# Patient Record
Sex: Female | Born: 2000 | Race: White | Hispanic: No | Marital: Single | Smoking: Former smoker
Health system: Southern US, Community
[De-identification: ages and names within clinical notes are randomized; demographics above are authoritative.]

## PROBLEM LIST (undated history)

## (undated) DIAGNOSIS — I10 Essential (primary) hypertension: Secondary | ICD-10-CM

## (undated) DIAGNOSIS — F909 Attention-deficit hyperactivity disorder, unspecified type: Secondary | ICD-10-CM

## (undated) HISTORY — DX: Attention-deficit hyperactivity disorder, unspecified type: F90.9

---
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
## 2018-03-14 IMAGING — DX DG CHEST 2V
2 series · 2 of 2 positions shown · non-contrast
Comparison: None available.

CLINICAL DATA: Initial evaluation for productive cough, shortness
of breath, chest pain, fever.

EXAM:
CHEST  2 VIEW

[chest pa]
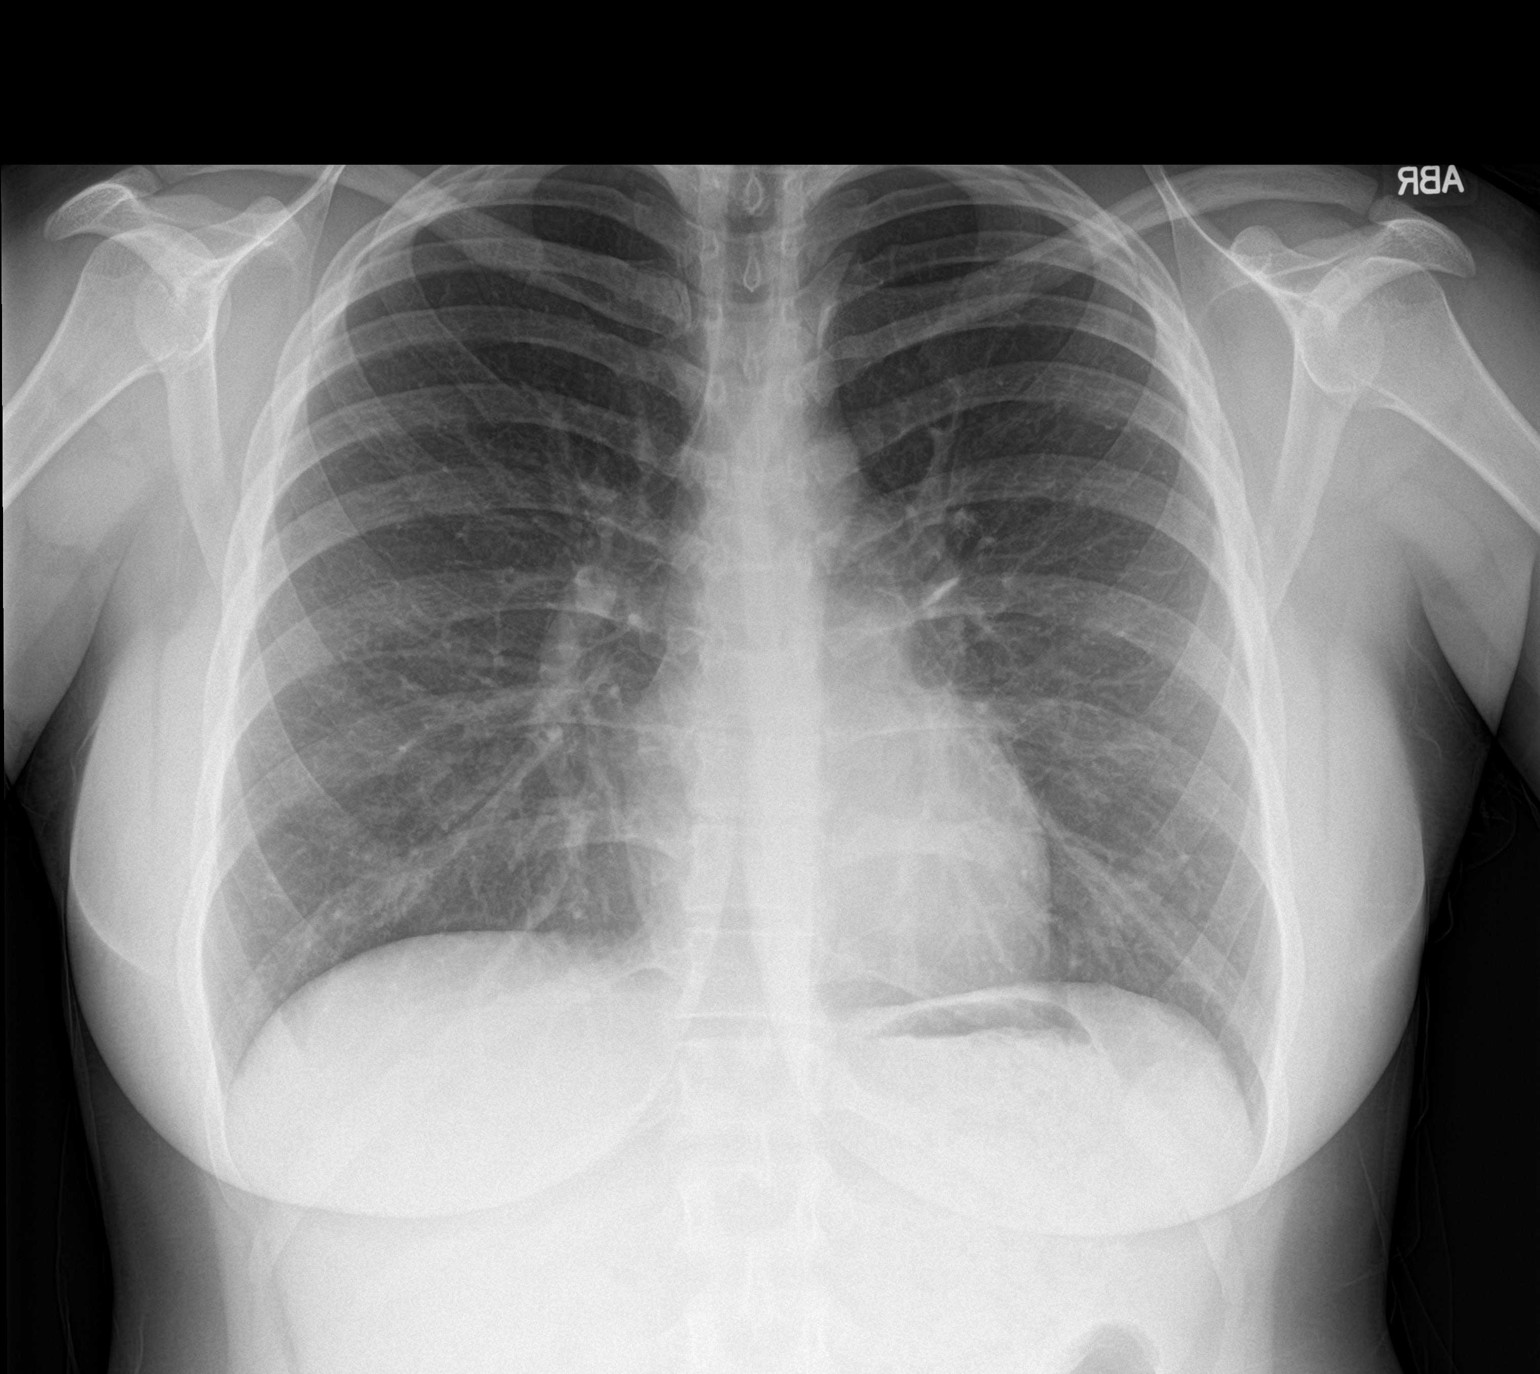

[chest lat]
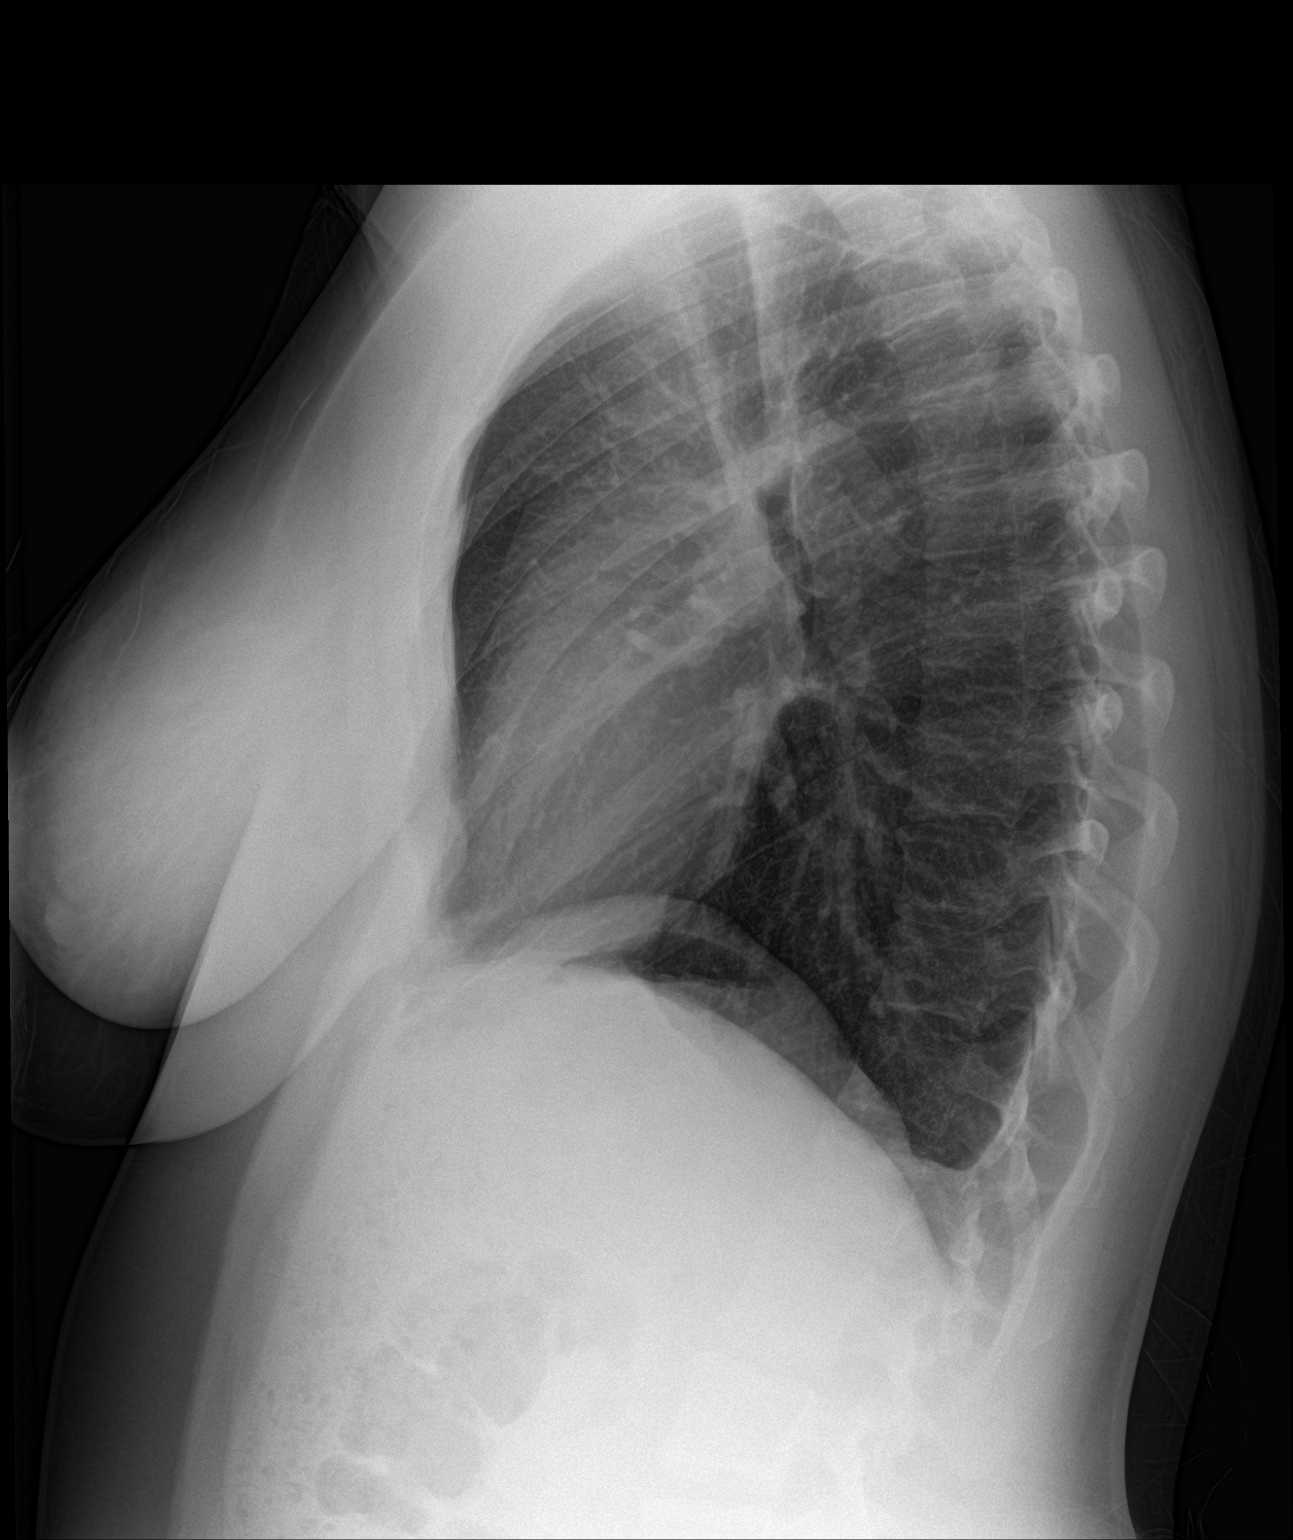

[2 of 2 positions shown; findings below may reference images not displayed]

FINDINGS: Cardiac and mediastinal silhouettes are within normal limits.

Lungs normally inflated. Scattered diffuse peribronchial thickening,
which may reflect sequelae bronchiolitis and/or reactive airways
disease. No consolidative airspace opacity to suggest pneumonia. No
pulmonary edema or pleural effusion. No pneumothorax.

No acute osseous abnormality.
IMPRESSION: Mild scattered peribronchial thickening, suggestive of acute
bronchiolitis given the history of cough and fever. No consolidative
opacity to suggest pneumonia identified.

## 2022-02-09 ENCOUNTER — Other Ambulatory Visit: Payer: Self-pay

## 2022-02-09 ENCOUNTER — Encounter (HOSPITAL_COMMUNITY): Payer: Self-pay

## 2022-02-09 ENCOUNTER — Emergency Department (HOSPITAL_COMMUNITY)
Admission: EM | Admit: 2022-02-09 | Discharge: 2022-02-09 | Disposition: A | Discharge status: Home / Self Care | Payer: Medicaid Other | Attending: Emergency Medicine | Admitting: Emergency Medicine

## 2022-02-09 DIAGNOSIS — Z3A20 20 weeks gestation of pregnancy: Secondary | ICD-10-CM | POA: Diagnosis not present

## 2022-02-09 DIAGNOSIS — O210 Mild hyperemesis gravidarum: Secondary | ICD-10-CM | POA: Insufficient documentation

## 2022-02-09 DIAGNOSIS — U071 COVID-19: Secondary | ICD-10-CM | POA: Diagnosis not present

## 2022-02-09 DIAGNOSIS — O98512 Other viral diseases complicating pregnancy, second trimester: Secondary | ICD-10-CM | POA: Diagnosis present

## 2022-02-09 DIAGNOSIS — R112 Nausea with vomiting, unspecified: Secondary | ICD-10-CM

## 2022-02-09 LAB — URINALYSIS, ROUTINE W REFLEX MICROSCOPIC
Bilirubin Urine: NEGATIVE
Glucose, UA: NEGATIVE mg/dL
Hgb urine dipstick: NEGATIVE
Ketones, ur: 80 mg/dL — AB
Leukocytes,Ua: NEGATIVE
Nitrite: NEGATIVE
Protein, ur: 30 mg/dL — AB
Specific Gravity, Urine: 1.028 (ref 1.005–1.030)
pH: 5 (ref 5.0–8.0)

## 2022-02-09 LAB — COMPREHENSIVE METABOLIC PANEL
ALT: 27 U/L (ref 0–44)
AST: 34 U/L (ref 15–41)
Albumin: 3.9 g/dL (ref 3.5–5.0)
Alkaline Phosphatase: 52 U/L (ref 38–126)
Anion gap: 9 (ref 5–15)
BUN: 8 mg/dL (ref 6–20)
CO2: 21 mmol/L — ABNORMAL LOW (ref 22–32)
Calcium: 9.1 mg/dL (ref 8.9–10.3)
Chloride: 106 mmol/L (ref 98–111)
Creatinine, Ser: 0.56 mg/dL (ref 0.44–1.00)
GFR, Estimated: >60 mL/min (ref >60)
Glucose, Bld: 97 mg/dL (ref 70–99)
Potassium: 3.5 mmol/L (ref 3.5–5.1)
Sodium: 136 mmol/L (ref 135–145)
Total Bilirubin: 0.3 mg/dL (ref 0.3–1.2)
Total Protein: 7.5 g/dL (ref 6.5–8.1)

## 2022-02-09 LAB — RESP PANEL BY RT-PCR (FLU A&B, COVID) ARPGX2
Influenza A by PCR: NEGATIVE
Influenza B by PCR: NEGATIVE
SARS Coronavirus 2 by RT PCR: POSITIVE — AB

## 2022-02-09 LAB — CBC
HCT: 40.3 % (ref 36.0–46.0)
Hemoglobin: 13.6 g/dL (ref 12.0–15.0)
MCH: 29.6 pg (ref 26.0–34.0)
MCHC: 33.7 g/dL (ref 30.0–36.0)
MCV: 87.6 fL (ref 80.0–100.0)
Platelets: 305 10*3/uL (ref 150–400)
RBC: 4.6 MIL/uL (ref 3.87–5.11)
RDW: 13.4 % (ref 11.5–15.5)
WBC: 9.7 10*3/uL (ref 4.0–10.5)
nRBC: 0 % (ref 0.0–0.2)

## 2022-02-09 LAB — CK: Total CK: 65 U/L (ref 38–234)

## 2022-02-09 LAB — LIPASE, BLOOD: Lipase: 24 U/L (ref 11–51)

## 2022-02-09 MED ORDER — DIPHENHYDRAMINE HCL 50 MG/ML IJ SOLN
25.0000 mg | Freq: Once | INTRAMUSCULAR | Status: AC
  Administered 2022-02-09: 25 mg via INTRAVENOUS
  Filled 2022-02-09: qty 1

## 2022-02-09 MED ORDER — LACTATED RINGERS IV BOLUS
2000.0000 mL | Freq: Once | INTRAVENOUS | Status: AC
  Administered 2022-02-09: 2000 mL via INTRAVENOUS

## 2022-02-09 MED ORDER — ONDANSETRON 4 MG PO TBDP: 4.0000 mg | ORAL_TABLET | Freq: Three times a day (TID) | ORAL | 0 refills | Status: DC | PRN

## 2022-02-09 MED ORDER — PROCHLORPERAZINE EDISYLATE 10 MG/2ML IJ SOLN
10.0000 mg | Freq: Once | INTRAMUSCULAR | Status: AC
  Administered 2022-02-09: 10 mg via INTRAVENOUS
  Filled 2022-02-09: qty 2

## 2022-02-09 MED ORDER — ACETAMINOPHEN 500 MG PO TABS
1000.0000 mg | ORAL_TABLET | Freq: Once | ORAL | Status: AC
  Administered 2022-02-09: 1000 mg via ORAL
  Filled 2022-02-09: qty 2

## 2022-02-09 MED ORDER — ONDANSETRON HCL 4 MG/2ML IJ SOLN
4.0000 mg | Freq: Once | INTRAMUSCULAR | Status: AC
  Administered 2022-02-09: 4 mg via INTRAVENOUS
  Filled 2022-02-09: qty 2

## 2022-02-09 MED ORDER — ALUM & MAG HYDROXIDE-SIMETH 200-200-20 MG/5ML PO SUSP
30.0000 mL | Freq: Once | ORAL | Status: DC
  Filled 2022-02-09: qty 30

## 2022-02-09 NOTE — ED Provider Notes (Signed)
[facility]-EMERGENCY DEPT [facility] Emergency Department Provider Note MRN:  [id number]  Arrival date & time: 02/09/22     Chief Complaint   Emesis   History of Present Illness   Rebecca Mcgrath is a 21 y.o. year-old female with a history of ADHD presenting to the ED with chief complaint of emesis.  24 hours of persistent nausea and vomiting.  Countless episodes of vomiting, starting to see a small amount of blood streaks in the vomit.  Denies diarrhea, passing gas normally.  Endorsing total body aches.  No chest pain or shortness of breath.  Feels very tired.  [redacted] weeks pregnant.  Review of Systems  A thorough review of systems was obtained and all systems are negative except as noted in the HPI and PMH.   Patient's Health History    Past Medical History:  Diagnosis Date   ADHD (attention deficit hyperactivity disorder)     History reviewed. No pertinent surgical history.  Family History  Problem Relation Age of Onset   Heart disease Mother    Hypertension Father     Social History   Socioeconomic History   Marital status: Single    Spouse name: Not on file   Number of children: Not on file   Years of education: Not on file   Highest education level: Not on file  Occupational History   Not on file  Tobacco Use   Smoking status: Never   Smokeless tobacco: Never  Substance and Sexual Activity   Alcohol use: No   Drug use: No   Sexual activity: Not on file  Other Topics Concern   Not on file  Social History Narrative   Not on file   Social Determinants of Health   Financial Resource Strain: Not on file  Food Insecurity: Not on file  Transportation Needs: Not on file  Physical Activity: Not on file  Stress: Not on file  Social Connections: Not on file  Intimate Partner Violence: Not on file     Physical Exam   Vitals:   02/09/22 0445 02/09/22 0500  BP: 112/80 104/65  Pulse: 91 98  Resp: 20 (!) 23  Temp:    SpO2: 97% 99%    CONSTITUTIONAL:  Well-appearing, NAD NEURO/PSYCH:  Alert and oriented x 3, no focal deficits EYES:  eyes equal and reactive ENT/NECK:  no LAD, no JVD CARDIO: Tachycardic rate, well-perfused, normal S1 and S2 PULM:  CTAB no wheezing or rhonchi GI/GU:  non-distended, non-tender MSK/SPINE:  No gross deformities, no edema SKIN:  no rash, atraumatic   *Additional and/or pertinent findings included in MDM below  Diagnostic and Interventional Summary    EKG Interpretation  Date/Time:  Thursday February 09 2022 00:33:22 EST Ventricular Rate:  135 PR Interval:  124 QRS Duration: 81 QT Interval:  297 QTC Calculation: 446 R Axis:   84 Text Interpretation: Sinus tachycardia Consider right atrial enlargement Borderline repol abnormality, diffuse leads Confirmed by Kennis Carina [phone]) on 02/09/2022 12:58:20 AM       Labs Reviewed  RESP PANEL BY RT-PCR (FLU A&B, COVID) ARPGX2 - Abnormal; Notable for the following components:      Result Value   SARS Coronavirus 2 by RT PCR POSITIVE (*)    All other components within normal limits  COMPREHENSIVE METABOLIC PANEL - Abnormal; Notable for the following components:   CO2 21 (*)    All other components within normal limits  URINALYSIS, ROUTINE W REFLEX MICROSCOPIC - Abnormal; Notable for the following components:  APPearance HAZY (*)    Ketones, ur 80 (*)    Protein, ur 30 (*)    Bacteria, UA RARE (*)    All other components within normal limits  LIPASE, BLOOD  CBC  CK    No orders to display    Medications  alum & mag hydroxide-simeth (MAALOX/MYLANTA) 200-200-20 MG/5ML suspension 30 mL (30 mLs Oral Patient Refused/Not Given 02/09/22 0214)  lactated ringers bolus 2,000 mL (0 mLs Intravenous Stopped 02/09/22 0317)  ondansetron (ZOFRAN) injection 4 mg (4 mg Intravenous Given 02/09/22 0053)  acetaminophen (TYLENOL) tablet 1,000 mg (1,000 mg Oral Given 02/09/22 0034)  prochlorperazine (COMPAZINE) injection 10 mg (10 mg Intravenous Given 02/09/22 0300)   diphenhydrAMINE (BENADRYL) injection 25 mg (25 mg Intravenous Given 02/09/22 0255)     Procedures  /  Critical Care Ultrasound ED OB Pelvic  Date/Time: 02/09/2022 1:23 AM Performed by: Sabas Sous, MD Authorized by: Sabas Sous, MD   Procedure details:    Indications: pregnant with abdominal pain     Assess:  Intrauterine pregnancy and fetal viability   Technique:  Transabdominal obstetric (HCG+) exam   Images: archived    Uterine findings:    Intrauterine pregnancy: identified     Single gestation: identified     Fetal heart rate: identified     Estimated gestational age: 65 weeks Comments:     Normal fetal activity and heart rate  ED Course and Medical Decision Making  Initial Impression and Ddx Nausea vomiting in the setting of pregnancy however hyperemesis not as common at 20 weeks.  Also considering gastroenteritis.  Abdomen soft and nontender, no fever, patient seems dehydrated given the amount of tachycardia and her general appearance.  Providing fluids and will reassess.  Past medical/surgical history that increases complexity of ED encounter: None  Interpretation of Diagnostics I personally reviewed the EKG and my interpretation is as follows: Sinus tachycardia with no concerning features    Labs are reassuring with no significant blood count or electrolyte disturbance.  COVID test is positive  Patient Reassessment and Ultimate Disposition/Management Patient feeling much better, resting comfortably, vital signs are normal, appropriate for discharge.  Patient management required discussion with the following services or consulting groups:  None  Complexity of Problems Addressed Acute illness or injury that poses threat of life of bodily function  Additional Data Reviewed and Analyzed Further history obtained from: Further history from spouse/family member  Additional Factors Impacting ED Encounter Risk Minor Procedures  Elmer Sow. Pilar Plate, MD [facility]  [facility] [email or web address]  Final Clinical Impressions(s) / ED Diagnoses     ICD-10-CM   1. Nausea and vomiting, unspecified vomiting type  R11.2     2. COVID-19  U07.1       ED Discharge Orders          Ordered    ondansetron (ZOFRAN-ODT) 4 MG disintegrating tablet  Every 8 hours PRN        02/09/22 0513             Discharge Instructions Discussed with and Provided to Patient:     Discharge Instructions      You were evaluated in the Emergency Department and after careful evaluation, we did not find any emergent condition requiring admission or further testing in the hospital.  Your exam/testing today was overall reassuring.  Symptoms seem to be due to COVID-19.  Recommend use of the Zofran medication as needed for nausea.  Tylenol at home for  discomfort, plenty of rest and fluids.  Please return to the Emergency Department if you experience any worsening of your condition.  Thank you for allowing [place] to be a part of your care.        Sabas Sous, MD 02/09/22 [phone]

## 2022-02-09 NOTE — ED Triage Notes (Addendum)
Patient arrives from home with c/o emesis ongoing since 10 pm 02/21. Pt states she has been unable to keep food or liquid down and endorses blood in her vomit. Pt also reporting generalized body aches and pains.  Pt states in triage, "my heart hurts", aprox [redacted] weeks pregnant.

## 2022-02-09 NOTE — Discharge Instructions (Signed)
You were evaluated in the Emergency Department and after careful evaluation, we did not find any emergent condition requiring admission or further testing in the hospital.  Your exam/testing today was overall reassuring.  Symptoms seem to be due to COVID-19.  Recommend use of the Zofran medication as needed for nausea.  Tylenol at home for discomfort, plenty of rest and fluids.  Please return to the Emergency Department if you experience any worsening of your condition.  Thank you for allowing [place] to be a part of your care.

## 2022-02-17 ENCOUNTER — Other Ambulatory Visit: Payer: Self-pay

## 2022-02-17 ENCOUNTER — Encounter (HOSPITAL_COMMUNITY): Payer: Self-pay | Admitting: *Deleted

## 2022-02-17 ENCOUNTER — Inpatient Hospital Stay (HOSPITAL_COMMUNITY)
Admission: AD | Admit: 2022-02-17 | Discharge: 2022-02-17 | Disposition: A | Discharge status: Home / Self Care | Payer: Medicaid Other | Attending: Obstetrics & Gynecology | Admitting: Obstetrics & Gynecology

## 2022-02-17 DIAGNOSIS — O99891 Other specified diseases and conditions complicating pregnancy: Secondary | ICD-10-CM | POA: Insufficient documentation

## 2022-02-17 DIAGNOSIS — K5900 Constipation, unspecified: Secondary | ICD-10-CM | POA: Insufficient documentation

## 2022-02-17 DIAGNOSIS — Z3A18 18 weeks gestation of pregnancy: Secondary | ICD-10-CM | POA: Diagnosis not present

## 2022-02-17 DIAGNOSIS — R000 Tachycardia, unspecified: Secondary | ICD-10-CM | POA: Insufficient documentation

## 2022-02-17 DIAGNOSIS — O99212 Obesity complicating pregnancy, second trimester: Secondary | ICD-10-CM | POA: Diagnosis not present

## 2022-02-17 DIAGNOSIS — E669 Obesity, unspecified: Secondary | ICD-10-CM | POA: Diagnosis not present

## 2022-02-17 DIAGNOSIS — O99612 Diseases of the digestive system complicating pregnancy, second trimester: Secondary | ICD-10-CM | POA: Insufficient documentation

## 2022-02-17 DIAGNOSIS — O210 Mild hyperemesis gravidarum: Secondary | ICD-10-CM | POA: Diagnosis present

## 2022-02-17 HISTORY — DX: Essential (primary) hypertension: I10

## 2022-02-17 LAB — URINALYSIS, ROUTINE W REFLEX MICROSCOPIC
Bilirubin Urine: NEGATIVE
Glucose, UA: NEGATIVE mg/dL
Hgb urine dipstick: NEGATIVE
Ketones, ur: 80 mg/dL — AB
Nitrite: NEGATIVE
Protein, ur: 30 mg/dL — AB
Specific Gravity, Urine: 1.027 (ref 1.005–1.030)
pH: 6 (ref 5.0–8.0)

## 2022-02-17 MED ORDER — PROMETHAZINE HCL 25 MG PO TABS
25.0000 mg | ORAL_TABLET | Freq: Four times a day (QID) | ORAL | 0 refills | Status: DC | PRN
Start: 2022-02-17 — End: 2022-06-29

## 2022-02-17 MED ORDER — SORBITOL 70 % SOLN
960.0000 mL | TOPICAL_OIL | Freq: Once | ORAL | Status: AC
  Administered 2022-02-17: 960 mL via RECTAL
  Filled 2022-02-17: qty 473

## 2022-02-17 MED ORDER — THIAMINE HCL 100 MG/ML IJ SOLN
Freq: Once | INTRAVENOUS | Status: AC
  Filled 2022-02-17: qty 1000

## 2022-02-17 MED ORDER — FOLIC ACID 5 MG/ML IJ SOLN: 1.0000 mg | Freq: Every day | INTRAMUSCULAR | Status: DC

## 2022-02-17 MED ORDER — PROMETHAZINE HCL 25 MG/ML IJ SOLN
25.0000 mg | Freq: Once | INTRAVENOUS | Status: AC
  Administered 2022-02-17: 25 mg via INTRAVENOUS
  Filled 2022-02-17: qty 1

## 2022-02-17 MED ORDER — THIAMINE HCL 100 MG/ML IJ SOLN: 100.0000 mg | Freq: Every day | INTRAMUSCULAR | Status: DC

## 2022-02-17 MED ORDER — FAMOTIDINE IN NACL 20-0.9 MG/50ML-% IV SOLN
20.0000 mg | Freq: Once | INTRAVENOUS | Status: AC
  Administered 2022-02-17: 20 mg via INTRAVENOUS
  Filled 2022-02-17: qty 50

## 2022-02-17 MED ORDER — SCOPOLAMINE 1 MG/3DAYS TD PT72
1.0000 | MEDICATED_PATCH | Freq: Once | TRANSDERMAL | Status: DC
  Administered 2022-02-17: 1.5 mg via TRANSDERMAL
  Filled 2022-02-17: qty 1

## 2022-02-17 MED ORDER — LACTATED RINGERS IV SOLN: INTRAVENOUS | Status: DC

## 2022-02-17 MED ORDER — POLYETHYLENE GLYCOL 3350 17 G PO PACK: 17.0000 g | PACK | Freq: Every day | ORAL | 0 refills | Status: DC

## 2022-02-17 NOTE — MAU Note (Signed)
.  DENETTA FEI is a 21 y.o. at [redacted]w[redacted]d here in MAU reporting: pt reports she has had n/v for about a week. Stated anything she eats she vomits. Describes emesis as just stomach acid with some blood mixed in it. Went to [facility] last week and they prescribed zofran but it went to a pharmacy in [place] and pt has not picked it up. Pt reports some abd discomfort but not painful.  ?Pt also concerned that her OB is not addressing her high blood pressure. Not on meds and stated her b/p runs about 140's/80's and there was protein in her urine last week and her OB did not address it.  ?LMP:  ?Onset of complaint: Feb 22 ?Pain score: 2 ?BP 104/68   Pulse (!) 103   Temp 98.2 ?F (36.8 ?C)   Resp 18   Ht 5\' 2"  (1.575 m)   Wt 83.5 kg   BMI 33.65 kg/m?  ? ?FHT:142 ?Lab orders placed from triage:  u/a  ?

## 2022-02-17 NOTE — MAU Provider Note (Signed)
?History  ?  ? ?CSN: [id number] ? ?Arrival date and time: 02/17/22 1336 ? ? Event Date/Time  ? First Provider Initiated Contact with Patient 02/17/22 1431   ?  ? ?Chief Complaint  ?Patient presents with  ? Emesis  ? ?Ms. Rebecca Mcgrath is a 21 y.o. year old G1P0 female at [redacted]w[redacted]d weeks gestation who presents to MAU reporting N/V x 1 week, unable to keep anything down, constipation, difficulty urinating except with vomiting, and feeling "uncomfortable." She reports that she has not had a BM since "before my last ER visit." She was seen at [facility] and given IVFs on 02/09/2022. She reports seeing red in her emesis last night. She also reports that she has "lost 17 lbs" since pregnancy started. Her pregravid weight was 203 lbs., weight today is 184 lbs; total weight loss is 19 lbs. ? ? ?OB History   ? ? Gravida  ?1  ? Para  ?   ? Term  ?   ? Preterm  ?   ? AB  ?   ? Living  ?   ?  ? ? SAB  ?   ? IAB  ?   ? Ectopic  ?   ? Multiple  ?   ? Live Births  ?   ?   ?  ?  ? ? ?Past Medical History:  ?Diagnosis Date  ? ADHD (attention deficit hyperactivity disorder)   ? Hypertension   ? ? ?History reviewed. No pertinent surgical history. ? ?Family History  ?Problem Relation Age of Onset  ? Heart disease Mother   ? Hypertension Father   ? ? ?Social History  ? ?Tobacco Use  ? Smoking status: Never  ? Smokeless tobacco: Never  ?Vaping Use  ? Vaping Use: Former  ?Substance Use Topics  ? Alcohol use: No  ? Drug use: No  ? ? ?Allergies: No Active Allergies ? ?Medications Prior to Admission  ?Medication Sig Dispense Refill Last Dose  ? albuterol (PROVENTIL HFA;VENTOLIN HFA) 108 (90 BASE) MCG/ACT inhaler Inhale 2 puffs into the lungs every 6 (six) hours as needed for wheezing or shortness of breath. 1 Inhaler 0 More than a month  ? benzonatate (TESSALON) 200 MG capsule Take 1 capsule (200 mg total) by mouth 3 (three) times daily as needed for cough. Swallow whole, do not chew 21 capsule 0   ? ibuprofen (ADVIL,MOTRIN) 600 MG tablet Take 1 tablet  (600 mg total) by mouth every 6 (six) hours as needed. 30 tablet 0   ? ondansetron (ZOFRAN-ODT) 4 MG disintegrating tablet Take 1 tablet (4 mg total) by mouth every 8 (eight) hours as needed for nausea or vomiting. 20 tablet 0   ? predniSONE (DELTASONE) 20 MG tablet Take 2 tablets (40 mg total) by mouth daily. 10 tablet 0   ? ? ?Review of Systems  ?Constitutional:  Positive for appetite change and unexpected weight change.  ?HENT: Negative.    ?Eyes: Negative.   ?Cardiovascular: Negative.   ?Gastrointestinal:  Positive for constipation, nausea and vomiting.  ?Endocrine: Negative.   ?Genitourinary: Negative.   ?Musculoskeletal: Negative.   ?Skin: Negative.   ?Allergic/Immunologic: Negative.   ?Neurological: Negative.   ?Hematological: Negative.   ?Psychiatric/Behavioral: Negative.    ?Physical Exam  ? ?Blood pressure 104/68, pulse (!) 103, temperature 98.2 ?F (36.8 ?C), resp. rate 18, height 5\' 2"  (1.575 m), weight 83.5 kg. ? ?Physical Exam ?Vitals and nursing note reviewed.  ?Constitutional:   ?   Appearance: Normal appearance. She  is obese.  ?HENT:  ?   Head: Normocephalic and atraumatic.  ?Cardiovascular:  ?   Rate and Rhythm: Tachycardia present.  ?Pulmonary:  ?   Effort: Pulmonary effort is normal.  ?Abdominal:  ?   Palpations: Abdomen is soft.  ?Genitourinary: ?   Comments: Not indicated ? ?Skin: ?   General: Skin is warm and dry.  ?Neurological:  ?   Mental Status: She is alert and oriented to person, place, and time.  ?Psychiatric:     ?   Mood and Affect: Mood normal.     ?   Behavior: Behavior normal.     ?   Thought Content: Thought content normal.     ?   Judgment: Judgment normal.  ? ?FHTs by doppler: 142 bpm  ?MAU Course  ?Procedures ? ?MDM ?IVFs: Phenergan 25 mg in LR 1000 ml @ 999 ml/hr; followed by LR 1000 ml with Folic Acid and Thiamine @ 999 ml/hr -- resolved nausea/vomiting ?Pepcid 20 mg IVPB ?Scopolamine patch ?SMOG Enema -- (+) BM with some abdominal cramping ?PO Challenge -- patient tolerated  well  ? ?Results for orders placed or performed during the hospital encounter of 02/17/22 (from the past 24 hour(s))  ?Urinalysis, Routine w reflex microscopic Urine, Clean Catch     Status: Abnormal  ? Collection Time: 02/17/22  2:35 PM  ?Result Value Ref Range  ? Color, Urine AMBER (A) YELLOW  ? APPearance CLOUDY (A) CLEAR  ? Specific Gravity, Urine 1.027 1.005 - 1.030  ? pH 6.0 5.0 - 8.0  ? Glucose, UA NEGATIVE NEGATIVE mg/dL  ? Hgb urine dipstick NEGATIVE NEGATIVE  ? Bilirubin Urine NEGATIVE NEGATIVE  ? Ketones, ur 80 (A) NEGATIVE mg/dL  ? Protein, ur 30 (A) NEGATIVE mg/dL  ? Nitrite NEGATIVE NEGATIVE  ? Leukocytes,Ua TRACE (A) NEGATIVE  ? RBC / HPF 6-10 0 - 5 RBC/hpf  ? WBC, UA 11-20 0 - 5 WBC/hpf  ? Bacteria, UA MANY (A) NONE SEEN  ? Squamous Epithelial / LPF 11-20 0 - 5  ? Mucus PRESENT   ?  ?Assessment and Plan  ?Morning sickness  ?- Rx for Phenergan 25 mg every 6 hours prn N/V ?- Information provided on morning sickness ? ?Constipation, unspecified constipation type  ?- Rx for Miralax 17g packet daily ? ?[redacted] weeks gestation of pregnancy  ? ?- Discharge patient ?- Keep scheduled appt with [facility] on 03/03/2022 ?- Patient verbalized an understanding of the plan of care and agrees.  ? ? ?Raelyn Mora, CNM ?02/17/2022, 3:10 PM  ?

## 2022-03-31 ENCOUNTER — Other Ambulatory Visit: Payer: Self-pay

## 2022-03-31 ENCOUNTER — Encounter (HOSPITAL_COMMUNITY): Payer: Self-pay | Admitting: Obstetrics & Gynecology

## 2022-03-31 ENCOUNTER — Inpatient Hospital Stay (HOSPITAL_COMMUNITY)
Admission: AD | Admit: 2022-03-31 | Discharge: 2022-03-31 | Disposition: A | Discharge status: Home / Self Care | Payer: Medicaid Other | Attending: Obstetrics & Gynecology | Admitting: Obstetrics & Gynecology

## 2022-03-31 DIAGNOSIS — O23592 Infection of other part of genital tract in pregnancy, second trimester: Secondary | ICD-10-CM | POA: Diagnosis not present

## 2022-03-31 DIAGNOSIS — O10912 Unspecified pre-existing hypertension complicating pregnancy, second trimester: Secondary | ICD-10-CM | POA: Insufficient documentation

## 2022-03-31 DIAGNOSIS — O4692 Antepartum hemorrhage, unspecified, second trimester: Secondary | ICD-10-CM | POA: Insufficient documentation

## 2022-03-31 DIAGNOSIS — Z3A24 24 weeks gestation of pregnancy: Secondary | ICD-10-CM | POA: Diagnosis not present

## 2022-03-31 DIAGNOSIS — Z6790 Unspecified blood type, Rh positive: Secondary | ICD-10-CM | POA: Diagnosis not present

## 2022-03-31 DIAGNOSIS — O98812 Other maternal infectious and parasitic diseases complicating pregnancy, second trimester: Secondary | ICD-10-CM | POA: Diagnosis not present

## 2022-03-31 DIAGNOSIS — B3731 Acute candidiasis of vulva and vagina: Secondary | ICD-10-CM | POA: Diagnosis not present

## 2022-03-31 LAB — URINALYSIS, ROUTINE W REFLEX MICROSCOPIC
Bilirubin Urine: NEGATIVE
Glucose, UA: NEGATIVE mg/dL
Hgb urine dipstick: NEGATIVE
Ketones, ur: NEGATIVE mg/dL
Nitrite: NEGATIVE
Protein, ur: NEGATIVE mg/dL
Specific Gravity, Urine: 1.001 — ABNORMAL LOW (ref 1.005–1.030)
pH: 7 (ref 5.0–8.0)

## 2022-03-31 LAB — WET PREP, GENITAL
Clue Cells Wet Prep HPF POC: NONE SEEN
Sperm: NONE SEEN
Trich, Wet Prep: NONE SEEN
WBC, Wet Prep HPF POC: >=10 — AB (ref <10)

## 2022-03-31 MED ORDER — TERCONAZOLE 0.4 % VA CREA: 1.0000 | TOPICAL_CREAM | Freq: Every day | VAGINAL | 0 refills | Status: AC

## 2022-03-31 NOTE — MAU Provider Note (Signed)
?History  ?  ? ?CSN: [id number] ? ?Arrival date and time: 03/31/22 1308 ? ? Event Date/Time  ? First Provider Initiated Contact with Patient 03/31/22 1412   ?  ? ?Chief Complaint  ?Patient presents with  ? Vaginal Bleeding  ? ?Ms. Rebecca Mcgrath is a 21 y.o. G1P0 at [redacted]w[redacted]d who presents to MAU for vaginal bleeding. Patient endorses 1 episode of vaginal bleeding this morning. Patient reports she went to urinate this morning, and when she wiped, she saw some mucus-like discharge tinged with blood. Patient reports she wiped a couple more times and the bleeding went away. Patient denies any additional bleeding episodes prior to today or since this morning. Patient reports last intercourse in October. Patient also reports some vague discomfort in the pelvis that appeared this morning with the bleeding and has not changed. Patient denies pain or contractions. Patient does not have any photos from the bleeding this morning. ? ?A review of records in Care Everywhere from [facility] prenatal care shows blood type O+. RhoGAM not indicated. ? ?Pt denies VB, LOF, ctx, decreased FM, vaginal discharge/odor/itching. ?Pt denies N/V, abdominal pain, constipation, diarrhea, or urinary problems. ?Pt denies fever, chills, fatigue, sweating or changes in appetite. ?Pt denies SOB or chest pain. ?Pt denies dizziness, HA, light-headedness, weakness. ? ? ?OB History   ? ? Gravida  ?1  ? Para  ?   ? Term  ?   ? Preterm  ?   ? AB  ?   ? Living  ?   ?  ? ? SAB  ?   ? IAB  ?   ? Ectopic  ?   ? Multiple  ?   ? Live Births  ?   ?   ?  ?  ? ? ?Past Medical History:  ?Diagnosis Date  ? ADHD (attention deficit hyperactivity disorder)   ? Hypertension   ? ? ?History reviewed. No pertinent surgical history. ? ?Family History  ?Problem Relation Age of Onset  ? Heart disease Mother   ? Hypertension Father   ? ? ?Social History  ? ?Tobacco Use  ? Smoking status: Never  ? Smokeless tobacco: Never  ?Vaping Use  ? Vaping Use: Former  ?Substance Use Topics  ?  Alcohol use: No  ? Drug use: No  ? ? ?Allergies: No Known Allergies ? ?Medications Prior to Admission  ?Medication Sig Dispense Refill Last Dose  ? folic acid (FOLVITE) 1 MG tablet Take 1 mg by mouth daily.   03/31/2022  ? Prenatal Vit-Fe Fumarate-FA (MULTIVITAMIN-PRENATAL) 27-0.8 MG TABS tablet Take 1 tablet by mouth daily at 12 noon.   03/31/2022  ? pyridoxine (B-6) 100 MG tablet Take 100 mg by mouth daily.   03/31/2022  ? famotidine (PEPCID) 10 MG tablet Take 10 mg by mouth 2 (two) times daily.   More than a month  ? polyethylene glycol (MIRALAX / GLYCOLAX) 17 g packet Take 17 g by mouth daily. 30 each 0 More than a month  ? promethazine (PHENERGAN) 25 MG tablet Take 1 tablet (25 mg total) by mouth every 6 (six) hours as needed for nausea or vomiting. Insert vaginally, if not able to keep anything down 30 tablet 0 More than a month  ? ? ?Review of Systems  ?Constitutional:  Negative for chills, diaphoresis, fatigue and fever.  ?Eyes:  Negative for visual disturbance.  ?Respiratory:  Negative for shortness of breath.   ?Cardiovascular:  Negative for chest pain.  ?Gastrointestinal:  Negative for abdominal  pain, constipation, diarrhea, nausea and vomiting.  ?Genitourinary:  Positive for vaginal bleeding. Negative for dysuria, flank pain, frequency, pelvic pain, urgency and vaginal discharge.  ?Neurological:  Negative for dizziness, weakness, light-headedness and headaches.  ? ?Physical Exam  ? ?Blood pressure 116/65, pulse 93, temperature 98.3 ?F (36.8 ?C), temperature source Oral, resp. rate 17, height 5\' 2"  (1.575 m), weight 91.4 kg, SpO2 98 %. ? ?Patient Vitals for the past 24 hrs: ? BP Temp Temp src Pulse Resp SpO2 Height Weight  ?03/31/22 1439 116/65 -- -- 93 -- -- -- --  ?03/31/22 1411 112/78 -- -- 100 -- 98 % -- --  ?03/31/22 1408 -- -- -- -- -- 97 % -- --  ?03/31/22 1352 110/69 98.3 ?F (36.8 ?C) Oral 97 17 96 % -- --  ?03/31/22 1349 110/69 98.3 ?F (36.8 ?C) Oral 99 19 96 % 5\' 2"  (1.575 m) 91.4 kg  ? ?Physical  Exam ?Vitals and nursing note reviewed. Exam conducted with a chaperone present.  ?Constitutional:   ?   General: She is not in acute distress. ?   Appearance: Normal appearance. She is not ill-appearing, toxic-appearing or diaphoretic.  ?HENT:  ?   Head: Normocephalic and atraumatic.  ?Pulmonary:  ?   Effort: Pulmonary effort is normal.  ?Genitourinary: ?   General: Normal vulva.  ?   Labia:     ?   Right: No rash, tenderness, lesion or injury.     ?   Left: No rash, tenderness, lesion or injury.   ?   Vagina: Vaginal discharge present. No tenderness, bleeding or lesions.  ?   Cervix: No friability, lesion, erythema or cervical bleeding.  ?   Comments: No blood or brown discharge visible in vagina. Small amount of white discharge, some clumped, possible yeast infection. Cervix visually closed. On exam, cervix long/closed/posterior. ?Neurological:  ?   Mental Status: She is alert and oriented to person, place, and time.  ?Psychiatric:     ?   Mood and Affect: Mood normal.     ?   Behavior: Behavior normal.     ?   Thought Content: Thought content normal.     ?   Judgment: Judgment normal.  ? ?Results for orders placed or performed during the hospital encounter of 03/31/22 (from the past 24 hour(s))  ?Urinalysis, Routine w reflex microscopic Urine, Clean Catch     Status: Abnormal  ? Collection Time: 03/31/22  2:19 PM  ?Result Value Ref Range  ? Color, Urine STRAW (A) YELLOW  ? APPearance CLEAR CLEAR  ? Specific Gravity, Urine 1.001 (L) 1.005 - 1.030  ? pH 7.0 5.0 - 8.0  ? Glucose, UA NEGATIVE NEGATIVE mg/dL  ? Hgb urine dipstick NEGATIVE NEGATIVE  ? Bilirubin Urine NEGATIVE NEGATIVE  ? Ketones, ur NEGATIVE NEGATIVE mg/dL  ? Protein, ur NEGATIVE NEGATIVE mg/dL  ? Nitrite NEGATIVE NEGATIVE  ? Leukocytes,Ua TRACE (A) NEGATIVE  ? RBC / HPF 0-5 0 - 5 RBC/hpf  ? WBC, UA 0-5 0 - 5 WBC/hpf  ? Bacteria, UA FEW (A) NONE SEEN  ?Wet prep, genital     Status: Abnormal  ? Collection Time: 03/31/22  2:34 PM  ?Result Value Ref  Range  ? Yeast Wet Prep HPF POC PRESENT (A) NONE SEEN  ? Trich, Wet Prep NONE SEEN NONE SEEN  ? Clue Cells Wet Prep HPF POC NONE SEEN NONE SEEN  ? WBC, Wet Prep HPF POC >=10 (A) <10  ? Sperm NONE SEEN   ? ?  MAU Course  ?Procedures ? ?MDM ?-VB per pt, no bleeding visualized on exam ?-cervix long/closed/posterior ?-EFM: reactive ?      -baseline: 135 ?      -variability: moderate ?      -accels: present, 10x10 ?      -decels: absent ?      -TOCO: quiet ?-WetPrep: + yeast ?-GC/CT collected ?-sending urine for culture, UA with leuks/few bacteria ?-pt discharged to home in stable condition ? ?Orders Placed This Encounter  ?Procedures  ? Culture, OB Urine  ?  Standing Status:   Standing  ?  Number of Occurrences:   1  ? Wet prep, genital  ?  Standing Status:   Standing  ?  Number of Occurrences:   1  ? Urinalysis, Routine w reflex microscopic Urine, Clean Catch  ?  Standing Status:   Standing  ?  Number of Occurrences:   1  ? Discharge patient  ?  Order Specific Question:   Discharge disposition  ?  Answer:   01-Home or Self Care [1]  ?  Order Specific Question:   Discharge patient date  ?  Answer:   03/31/2022  ? ?Meds ordered this encounter  ?Medications  ? terconazole (TERAZOL 7) 0.4 % vaginal cream  ?  Sig: Place 1 applicator vaginally at bedtime for 7 days.  ?  Dispense:  45 g  ?  Refill:  0  ?  Order Specific Question:   Supervising Provider  ?  Answer:   Reva BoresRATT, TANYA S [[id number]]  ? ?Assessment and Plan  ? ?1. Vaginal bleeding in pregnancy, second trimester   ?2. Blood type, Rh positive   ?3. Vaginal yeast infection   ?4. [redacted] weeks gestation of pregnancy   ? ?Allergies as of 03/31/2022   ?No Known Allergies ?  ? ?  ?Medication List  ?  ? ?TAKE these medications   ? ?famotidine 10 MG tablet ?Commonly known as: PEPCID ?Take 10 mg by mouth 2 (two) times daily. ?  ?folic acid 1 MG tablet ?Commonly known as: FOLVITE ?Take 1 mg by mouth daily. ?  ?multivitamin-prenatal 27-0.8 MG Tabs tablet ?Take 1 tablet by mouth daily at 12  noon. ?  ?polyethylene glycol 17 g packet ?Commonly known as: MIRALAX / GLYCOLAX ?Take 17 g by mouth daily. ?  ?promethazine 25 MG tablet ?Commonly known as: PHENERGAN ?Take 1 tablet (25 mg total) by mouth every 6

## 2022-03-31 NOTE — MAU Note (Signed)
..  Rebecca Mcgrath is a 21 y.o. at [redacted]w[redacted]d here in MAU reporting: Vaginal bleeding that started at 0910 when she got up to use the restroom she saw blood when she wiped. Rates 7/10 pain lower abdominal cramping.  ? ?110/69 BP  ?96 O2 SAT  ?97 HR.  ?Temp 98.3 ? ?Felipa Furnace RN.  ?

## 2022-04-01 LAB — CULTURE, OB URINE: Culture: NO GROWTH

## 2022-04-03 LAB — GC/CHLAMYDIA PROBE AMP (~~LOC~~) NOT AT ARMC
Chlamydia: NEGATIVE
Comment: NEGATIVE
Comment: NORMAL
Neisseria Gonorrhea: NEGATIVE

## 2022-05-07 ENCOUNTER — Encounter (HOSPITAL_COMMUNITY): Payer: Self-pay

## 2022-05-07 ENCOUNTER — Emergency Department (HOSPITAL_COMMUNITY)
Admission: EM | Admit: 2022-05-07 | Discharge: 2022-05-07 | Disposition: A | Discharge status: Home / Self Care | Payer: Medicaid Other | Attending: Emergency Medicine | Admitting: Emergency Medicine

## 2022-05-07 ENCOUNTER — Other Ambulatory Visit: Payer: Self-pay

## 2022-05-07 DIAGNOSIS — Z3A30 30 weeks gestation of pregnancy: Secondary | ICD-10-CM | POA: Diagnosis not present

## 2022-05-07 DIAGNOSIS — R112 Nausea with vomiting, unspecified: Secondary | ICD-10-CM

## 2022-05-07 DIAGNOSIS — N390 Urinary tract infection, site not specified: Secondary | ICD-10-CM

## 2022-05-07 DIAGNOSIS — O219 Vomiting of pregnancy, unspecified: Secondary | ICD-10-CM | POA: Diagnosis not present

## 2022-05-07 DIAGNOSIS — K920 Hematemesis: Secondary | ICD-10-CM

## 2022-05-07 LAB — CBC WITH DIFFERENTIAL/PLATELET
Abs Immature Granulocytes: 0.04 10*3/uL (ref 0.00–0.07)
Basophils Absolute: 0 10*3/uL (ref 0.0–0.1)
Basophils Relative: 0 %
Eosinophils Absolute: 0.1 10*3/uL (ref 0.0–0.5)
Eosinophils Relative: 1 %
HCT: 33.3 % — ABNORMAL LOW (ref 36.0–46.0)
Hemoglobin: 10.8 g/dL — ABNORMAL LOW (ref 12.0–15.0)
Immature Granulocytes: 0 %
Lymphocytes Relative: 19 %
Lymphs Abs: 2 10*3/uL (ref 0.7–4.0)
MCH: 29.8 pg (ref 26.0–34.0)
MCHC: 32.4 g/dL (ref 30.0–36.0)
MCV: 92 fL (ref 80.0–100.0)
Monocytes Absolute: 0.5 10*3/uL (ref 0.1–1.0)
Monocytes Relative: 5 %
Neutro Abs: 7.9 10*3/uL — ABNORMAL HIGH (ref 1.7–7.7)
Neutrophils Relative %: 75 %
Platelets: 290 10*3/uL (ref 150–400)
RBC: 3.62 MIL/uL — ABNORMAL LOW (ref 3.87–5.11)
RDW: 13.2 % (ref 11.5–15.5)
WBC: 10.6 10*3/uL — ABNORMAL HIGH (ref 4.0–10.5)
nRBC: 0 % (ref 0.0–0.2)

## 2022-05-07 LAB — COMPREHENSIVE METABOLIC PANEL
ALT: 13 U/L (ref 0–44)
AST: 17 U/L (ref 15–41)
Albumin: 3.2 g/dL — ABNORMAL LOW (ref 3.5–5.0)
Alkaline Phosphatase: 58 U/L (ref 38–126)
Anion gap: 7 (ref 5–15)
BUN: 7 mg/dL (ref 6–20)
CO2: 22 mmol/L (ref 22–32)
Calcium: 9.1 mg/dL (ref 8.9–10.3)
Chloride: 110 mmol/L (ref 98–111)
Creatinine, Ser: 0.47 mg/dL (ref 0.44–1.00)
GFR, Estimated: >60 mL/min (ref >60)
Glucose, Bld: 136 mg/dL — ABNORMAL HIGH (ref 70–99)
Potassium: 3.7 mmol/L (ref 3.5–5.1)
Sodium: 139 mmol/L (ref 135–145)
Total Bilirubin: 0.5 mg/dL (ref 0.3–1.2)
Total Protein: 6.5 g/dL (ref 6.5–8.1)

## 2022-05-07 LAB — URINALYSIS, ROUTINE W REFLEX MICROSCOPIC
Bilirubin Urine: NEGATIVE
Glucose, UA: NEGATIVE mg/dL
Hgb urine dipstick: NEGATIVE
Ketones, ur: 20 mg/dL — AB
Nitrite: NEGATIVE
Protein, ur: 30 mg/dL — AB
Specific Gravity, Urine: 1.017 (ref 1.005–1.030)
pH: 8 (ref 5.0–8.0)

## 2022-05-07 LAB — LIPASE, BLOOD: Lipase: 21 U/L (ref 11–51)

## 2022-05-07 MED ORDER — CEPHALEXIN 500 MG PO CAPS: 500.0000 mg | ORAL_CAPSULE | Freq: Four times a day (QID) | ORAL | 0 refills | Status: DC

## 2022-05-07 MED ORDER — CEFTRIAXONE SODIUM 1 G IJ SOLR
1.0000 g | Freq: Once | INTRAMUSCULAR | Status: AC
  Administered 2022-05-07: 1 g via INTRAVENOUS
  Filled 2022-05-07: qty 10

## 2022-05-07 MED ORDER — SODIUM CHLORIDE 0.9 % IV BOLUS
1000.0000 mL | Freq: Once | INTRAVENOUS | Status: AC
Start: 2022-05-07 — End: 2022-05-07
  Administered 2022-05-07: 1000 mL via INTRAVENOUS

## 2022-05-07 MED ORDER — ONDANSETRON HCL 4 MG/2ML IJ SOLN
4.0000 mg | Freq: Once | INTRAMUSCULAR | Status: AC
  Administered 2022-05-07: 4 mg via INTRAVENOUS
  Filled 2022-05-07: qty 2

## 2022-05-07 MED ORDER — PANTOPRAZOLE SODIUM 20 MG PO TBEC: 20.0000 mg | DELAYED_RELEASE_TABLET | Freq: Every day | ORAL | 0 refills | Status: DC

## 2022-05-07 NOTE — ED Notes (Signed)
This RN spoke with [facility] RN- States is coming to assess pt

## 2022-05-07 NOTE — Progress Notes (Signed)
Pt is a G1P0 at 56 3/[redacted] weeks gestation presenting with c/o vomiting x 2-3 days. Pt says she has seen bright red blood in her emesis.Pt says she has had N&V throughout this pregnant. She gets her care at [facility] in [place]. She denies any vaginal bleeding or leaking of fluid. Reports good fetal movement.

## 2022-05-07 NOTE — ED Provider Notes (Signed)
[facility]-EMERGENCY DEPT Provider Note   CSN: [id number] Arrival date & time: 05/07/22  1150     History  Chief Complaint  Patient presents with   [redacted] weeks pregnant   Hematemesis    Rebecca Mcgrath is a 21 y.o. female.  The history is provided by the patient and medical records. No language interpreter was used.   21 year old G1P0 female who is [redacted] weeks pregnant presents for evaluation of persistent nausea.  Patient mentioned that she has had nausea throughout her pregnancy.  She also has recurrent vomiting usually noticing trace of blood in her vomitus.  This is an ongoing issue.  She mention several from persistent heartburn tries to avoid eating while at work working for [employer].  States her eating is inconsistent and she suffers from persistent heartburn.  She felt that she is not getting adequate care from her current OB/GYN.  She would like to be evaluated to make sure "everything is okay for me and my baby".  She does not endorse any fever, productive cough, hemoptysis, dysuria, abdominal pain, vaginal bleeding, or lack of fetal movement.  Last emesis was this morning.  Home Medications Prior to Admission medications   Medication Sig Start Date End Date Taking? Authorizing Provider  famotidine (PEPCID) 10 MG tablet Take 10 mg by mouth 2 (two) times daily.    [provider]  folic acid (FOLVITE) 1 MG tablet Take 1 mg by mouth daily.    [provider]  polyethylene glycol (MIRALAX / GLYCOLAX) 17 g packet Take 17 g by mouth daily. 02/17/22   Raelyn Moraawson, Rolitta, CNM  Prenatal Vit-Fe Fumarate-FA (MULTIVITAMIN-PRENATAL) 27-0.8 MG TABS tablet Take 1 tablet by mouth daily at 12 noon.    [provider]  promethazine (PHENERGAN) 25 MG tablet Take 1 tablet (25 mg total) by mouth every 6 (six) hours as needed for nausea or vomiting. Insert vaginally, if not able to keep anything down 02/17/22   Raelyn Moraawson, Rolitta, CNM  pyridoxine (B-6) 100 MG  tablet Take 100 mg by mouth daily.    [provider]      Allergies    Patient has no known allergies.    Review of Systems   Review of Systems  All other systems reviewed and are negative.  Physical Exam Updated Vital Signs BP 127/85 (BP Location: Right Arm)   Pulse 99   Temp 98.2 F (36.8 C) (Oral)   Resp 16   Ht 5\' 2"  (1.575 m)   Wt 77.1 kg   SpO2 98%   BMI 31.09 kg/m  Physical Exam Vitals and nursing note reviewed.  Constitutional:      General: She is not in acute distress.    Appearance: She is well-developed. She is obese.  HENT:     Head: Atraumatic.  Eyes:     Conjunctiva/sclera: Conjunctivae normal.  Cardiovascular:     Rate and Rhythm: Normal rate and regular rhythm.     Pulses: Normal pulses.     Heart sounds: Normal heart sounds.  Pulmonary:     Effort: Pulmonary effort is normal.     Breath sounds: No wheezing, rhonchi or rales.  Abdominal:     Palpations: Abdomen is soft.     Tenderness: There is no abdominal tenderness.     Comments: Gravid abdomen  Musculoskeletal:     Cervical back: Neck supple.  Skin:    Findings: No rash.  Neurological:     Mental Status: She is alert.  Mental status is at baseline.  Psychiatric:        Mood and Affect: Mood normal.    ED Results / Procedures / Treatments   Labs (all labs ordered are listed, but only abnormal results are displayed) Labs Reviewed  CBC WITH DIFFERENTIAL/PLATELET - Abnormal; Notable for the following components:      Result Value   WBC 10.6 (*)    RBC 3.62 (*)    Hemoglobin 10.8 (*)    HCT 33.3 (*)    Neutro Abs 7.9 (*)    All other components within normal limits  COMPREHENSIVE METABOLIC PANEL - Abnormal; Notable for the following components:   Glucose, Bld 136 (*)    Albumin 3.2 (*)    All other components within normal limits  URINALYSIS, ROUTINE W REFLEX MICROSCOPIC - Abnormal; Notable for the following components:   APPearance CLOUDY (*)    Ketones, ur 20 (*)     Protein, ur 30 (*)    Leukocytes,Ua MODERATE (*)    Bacteria, UA MANY (*)    All other components within normal limits  URINE CULTURE  LIPASE, BLOOD    EKG None  Radiology No results found.  Procedures Procedures    Medications Ordered in ED Medications  cefTRIAXone (ROCEPHIN) 1 g in sodium chloride 0.9 % 100 mL IVPB (has no administration in time range)  sodium chloride 0.9 % bolus 1,000 mL (1,000 mLs Intravenous New Bag/Given 05/07/22 1307)  ondansetron (ZOFRAN) injection 4 mg (4 mg Intravenous Given 05/07/22 1308)    ED Course/ Medical Decision Making/ A&P                           Medical Decision Making Amount and/or Complexity of Data Reviewed Labs: ordered.  Risk Prescription drug management.   BP 127/85 (BP Location: Right Arm)   Pulse 99   Temp 98.2 F (36.8 C) (Oral)   Resp 16   Ht 5\' 2"  (1.575 m)   Wt 77.1 kg   SpO2 98%   BMI 31.09 kg/m   12:46 PM This is a 21 year old female G1, P0 approximately [redacted] weeks pregnant presenting complaining of hyperemesis gravidarum with hematochezia which has been an ongoing issue throughout her pregnancy.  She felt her primary concern is the lack of prenatal care that she received from OB/GYN and she would like to be evaluated to ensure that everything is fine for her pregnancy.  She admits that she is not eating and drinking appropriately as she does not eat much while she is working.  She works approximately 45 to 50 hours a week and she normally likes to drink cold fluid as opposed to eating solid food.  She mention eating seems to worsen her symptoms causing her to vomit.  She usually noticed trace of blood in her vomitus and her vomitus is usually "projectile".  I suspect patient may suffer from Mallory-weiss tear from her vomiting causing bleeding.  I have considered peptic ulcer disease, Boerhave's, gastritis, GERD.  Since patient is [redacted] weeks pregnant, will request rapid OB nurse to assess her fetus.  Work-up initiated,  will give IV fluid, and antinausea medication meantime.  2:32 PM Labs obtained and independently reviewed interpreted by me.  Labs remarkable for a urine that shows cloudy urine, moderate leukocyte Estrace and many bacteria suggestive of underlying urinary tract infection.  Especially in the setting of patient being pregnant, she would benefit from antibiotic to treat UTI.  Urine culture  sent.  We will give Rocephin in the ED.  The remainder of her labs are reassuring.  Appreciate evaluation by our rapid OB nurse who has successful the status of the fetus and was noted to shows no concerning findings.  OB/GYN, Dr. Vergie Living, was involved in the care and has cleared patient from an OB standpoint.  As far as hematemesis, patient has not vomited in the ED.  I felt that this is likely due to forceful vomiting, likely Mallory Weiss tear but I will also give patient GI follow-up in case symptoms progress.  She does have medication at home such as Zofran, Phenergan, and PPI.  At this time patient tolerates p.o. therefore I do not think she needs to need admission I anticipate her symptoms may improve after she treats her underlying urinary tract infection.  3:14 PM Pt sign out to oncoming provider who will determine disposition once pt finished with IV abx and tolerates PO.    This patient presents to the ED for concern of hematemesis, this involves an extensive number of treatment options, and is a complaint that carries with it a high risk of complications and morbidity.  The differential diagnosis includes Malloryweiss tear, Boehaave syndrome, gastritis, PUD, GERD, esophageal rupture  Co morbidities that complicate the patient evaluation pregnancy Additional history obtained:  Additional history obtained from family member External records from outside source obtained and reviewed including notes from OBGYN  Lab Tests:  I Ordered, and personally interpreted labs.  The pertinent results include:  as  above   Cardiac Monitoring:  The patient was maintained on a cardiac monitor.  I personally viewed and interpreted the cardiac monitored which showed an underlying rhythm of: NSR  Medicines ordered and prescription drug management:  I ordered medication including rocephin  for UTI Reevaluation of the patient after these medicines showed that the patient improved I have reviewed the patients home medicines and have made adjustments as needed  Test Considered: CT abd/pelvis but low suspicion for esophageal perforation and due to being pregnant will avoid radiation exposure to fetus  EGD: can be done outpt  Critical Interventions: IV abx  Antiemetic  IVF  Consultations Obtained:  I requested consultation with the OBGYN,  and discussed lab and imaging findings as well as pertinent plan - they recommend: OB cleared  Problem List / ED Course: hematemesis  UTI  Reevaluation:  After the interventions noted above, I reevaluated the patient and found that they have :improved  Social Determinants of Health:   Dispostion:  After consideration of the diagnostic results and the patients response to treatment, I feel that the patent would benefit from outpt f/u with OB.         Final Clinical Impression(s) / ED Diagnoses Final diagnoses:  None    Rx / DC Orders ED Discharge Orders     None         Fayrene Helper, PA-C 05/07/22 1515    Milagros Loll, MD 05/09/22 [phone]

## 2022-05-07 NOTE — ED Triage Notes (Signed)
Patient states she has vomited x 2-3 days and states that when she vomits it is bright blood.  Patient sates she is [redacted] weeks pregnant.

## 2022-05-07 NOTE — ED Notes (Signed)
Pt d/c home per MD order. Discharge summary reviewed, pt verbalizes understanding. Ambulatory off unit. No s/s of acute distress noted at discharge.  °

## 2022-05-07 NOTE — Progress Notes (Signed)
Spoke with Dr. Ilda Basset. NST is reactive. No uc's, bleeding or leaking of fluid. Pt is here c/o voming for 2-3 days. Has seen bright red blood in her emesis. Says that the ED can draw labs  and trat the pt. She is OB cleared.

## 2022-05-07 NOTE — Discharge Instructions (Signed)
You have been evaluated for your symptoms.  It appears you have a urinary tract infection which sometimes can cause worsening of your nausea and vomiting.  Please take antibiotic as prescribed for the full duration.  Continue taking Protonix to help decrease acid buildup in the stomach causing discomfort.  Avoid forceful vomiting as it can cause tears in your mucosal lining which can lead to bleeding.  Follow-up closely with your OB/GYN for further managements of your pregnancy.

## 2022-05-07 NOTE — ED Provider Notes (Signed)
Signout received on this 21 year old female who is [redacted] weeks pregnant.  Work-up without concerns for intra-abdominal process, or complication of pregnancy.  Concern for UTI.  Patient received dose of antibiotics.  At the time of signout patient is awaiting completion of IV antibiotics, and p.o. trial.   Physical Exam  BP (!) 100/56   Pulse 87   Temp 98 F (36.7 C) (Oral)   Resp 18   Ht 5\' 2"  (1.575 m)   Wt 77.1 kg   SpO2 100%   BMI 31.09 kg/m     Procedures  Procedures  ED Course / MDM    Medical Decision Making Amount and/or Complexity of Data Reviewed Labs: ordered.  Risk Prescription drug management.   Symptoms improved.  P.o. tolerated.  Patient discharged.       Evlyn Courier, PA-C 05/07/22 1559    Teressa Lower, MD 05/07/22 [phone]

## 2022-05-07 NOTE — ED Notes (Signed)
Secretary to page Rapid [facility]

## 2022-05-07 NOTE — ED Notes (Signed)
OB nurse to bedside

## 2022-05-08 LAB — URINE CULTURE: Culture: NO GROWTH

## 2022-06-29 ENCOUNTER — Encounter (HOSPITAL_COMMUNITY): Payer: Self-pay | Admitting: Family Medicine

## 2022-06-29 ENCOUNTER — Other Ambulatory Visit: Payer: Self-pay

## 2022-06-29 ENCOUNTER — Inpatient Hospital Stay (HOSPITAL_COMMUNITY)
Admission: AD | Admit: 2022-06-29 | Discharge: 2022-06-29 | Disposition: A | Discharge status: Home / Self Care | Payer: Medicaid Other | Attending: Family Medicine | Admitting: Family Medicine

## 2022-06-29 DIAGNOSIS — O26893 Other specified pregnancy related conditions, third trimester: Secondary | ICD-10-CM | POA: Insufficient documentation

## 2022-06-29 DIAGNOSIS — O479 False labor, unspecified: Secondary | ICD-10-CM | POA: Diagnosis not present

## 2022-06-29 DIAGNOSIS — R350 Frequency of micturition: Secondary | ICD-10-CM | POA: Diagnosis not present

## 2022-06-29 DIAGNOSIS — O471 False labor at or after 37 completed weeks of gestation: Secondary | ICD-10-CM | POA: Diagnosis present

## 2022-06-29 DIAGNOSIS — Z3A37 37 weeks gestation of pregnancy: Secondary | ICD-10-CM | POA: Insufficient documentation

## 2022-06-29 LAB — URINALYSIS, ROUTINE W REFLEX MICROSCOPIC
Bilirubin Urine: NEGATIVE
Glucose, UA: NEGATIVE mg/dL
Ketones, ur: 80 mg/dL — AB
Nitrite: NEGATIVE
Protein, ur: 100 mg/dL — AB
RBC / HPF: >50 RBC/hpf — ABNORMAL HIGH (ref 0–5)
Specific Gravity, Urine: 1.021 (ref 1.005–1.030)
Squamous Epithelial / HPF: >50 — ABNORMAL HIGH (ref 0–5)
WBC, UA: >50 WBC/hpf — ABNORMAL HIGH (ref 0–5)
pH: 6 (ref 5.0–8.0)

## 2022-06-29 NOTE — MAU Provider Note (Signed)
None     Chief Complaint:  Contractions and Urinary Frequency   KWEEN BACORN is  21 y.o. G1P0 at [redacted]w[redacted]d presents complaining of Contractions and Urinary Frequency .  Baby is active, no bleeding. Obstetrical/Gynecological History: OB History     Gravida  1   Para      Term      Preterm      AB      Living         SAB      IAB      Ectopic      Multiple      Live Births             Past Medical History: Past Medical History:  Diagnosis Date   ADHD (attention deficit hyperactivity disorder)    Hypertension     Past Surgical History: No past surgical history on file.  Family History: Family History  Problem Relation Age of Onset   Heart disease Mother    Hypertension Father     Social History: Social History   Tobacco Use   Smoking status: Former    Types: E-cigarettes    Quit date: 12/01/2021    Years since quitting: 0.5   Smokeless tobacco: Never  Vaping Use   Vaping Use: Former   Substances: Nicotine  Substance Use Topics   Alcohol use: No   Drug use: No    Allergies: No Known Allergies  Meds:  Medications Prior to Admission  Medication Sig Dispense Refill Last Dose   calcium carbonate (TUMS - DOSED IN MG ELEMENTAL CALCIUM) 500 MG chewable tablet Chew 1 tablet by mouth 3 (three) times daily.   06/29/2022   folic acid (FOLVITE) 1 MG tablet Take 1 mg by mouth daily.   06/29/2022   Prenatal Vit-Fe Fumarate-FA (MULTIVITAMIN-PRENATAL) 27-0.8 MG TABS tablet Take 1 tablet by mouth daily at 12 noon.   06/29/2022   promethazine (PHENERGAN) 25 MG tablet Take 1 tablet (25 mg total) by mouth every 6 (six) hours as needed for nausea or vomiting. Insert vaginally, if not able to keep anything down 30 tablet 0 Past Month   pyridoxine (B-6) 100 MG tablet Take 100 mg by mouth daily.   06/28/2022   cephALEXin (KEFLEX) 500 MG capsule Take 1 capsule (500 mg total) by mouth 4 (four) times daily. 20 capsule 0    famotidine (PEPCID) 10 MG tablet Take 10 mg by  mouth 2 (two) times daily.      ondansetron (ZOFRAN) 8 MG tablet Take 8 mg by mouth every 8 (eight) hours as needed for nausea/vomiting.      pantoprazole (PROTONIX) 20 MG tablet Take 1 tablet (20 mg total) by mouth daily. 14 tablet 0    polyethylene glycol (MIRALAX / GLYCOLAX) 17 g packet Take 17 g by mouth daily. (Patient not taking: Reported on 05/07/2022) 30 each 0     Review of Systems   Constitutional: Negative for fever and chills Eyes: Negative for visual disturbances Respiratory: Negative for shortness of breath, dyspnea Cardiovascular: Negative for chest pain or palpitations  Gastrointestinal: Negative for vomiting, diarrhea and constipation Genitourinary: Negative for dysuria and urgency Musculoskeletal: Negative for back pain, joint pain, myalgias.  Normal ROM  Neurological: Negative for dizziness and headaches    Physical Exam  Blood pressure 126/79, pulse 100, temperature 98 F (36.7 C), temperature source Oral, resp. rate 20, height 5\' 2"  (1.575 m), weight 91.4 kg, SpO2 100 %. GENERAL: Well-developed, well-nourished female in no  acute distress.  LUNGS: Normal respiratory effort HEART: Regular rate and rhythm. ABDOMEN: Soft, nontender, nondistended, gravid.  EXTREMITIES: Nontender, no edema, 2+ distal pulses. DTR's 2+ CERVICAL EXAM: Dilatation 1.5cm   Effacement 70%   Station -2 X 2 exams; no change in cx   Presentation: cephalic FHT:  Baseline rate 1258 bpm   Variability moderate  Accelerations present   Decelerations none, reactive NST Contractions: UI on EFM  Labs: Results for orders placed or performed during the hospital encounter of 06/29/22 (from the past 24 hour(s))  Urinalysis, Routine w reflex microscopic Urine, Clean Catch   Collection Time: 06/29/22  7:33 PM  Result Value Ref Range   Color, Urine AMBER (A) YELLOW   APPearance TURBID (A) CLEAR   Specific Gravity, Urine 1.021 1.005 - 1.030   pH 6.0 5.0 - 8.0   Glucose, UA NEGATIVE NEGATIVE mg/dL   Hgb  urine dipstick SMALL (A) NEGATIVE   Bilirubin Urine NEGATIVE NEGATIVE   Ketones, ur 80 (A) NEGATIVE mg/dL   Protein, ur 831 (A) NEGATIVE mg/dL   Nitrite NEGATIVE NEGATIVE   Leukocytes,Ua LARGE (A) NEGATIVE   RBC / HPF >50 (H) 0 - 5 RBC/hpf   WBC, UA >50 (H) 0 - 5 WBC/hpf   Bacteria, UA FEW (A) NONE SEEN   Squamous Epithelial / LPF >50 (H) 0 - 5   Mucus PRESENT    Non Squamous Epithelial 6-10 (A) NONE SEEN   Imaging Studies:  No results found.  Assessment: GLENNIS BORGER is  21 y.o. G1P0 at [redacted]w[redacted]d presents with UI/pressure.  Plan: DC home w/labor precautions  Jacklyn Shell 7/13/20239:44 PM

## 2022-06-29 NOTE — Discharge Instructions (Signed)

## 2022-06-29 NOTE — MAU Note (Signed)
..  Rebecca Mcgrath is a 21 y.o. at [redacted]w[redacted]d here in MAU reporting: Since 4pm she has felt tightening that lasts around 30-40 minutes. Is not sure if it is contractions or baby kicking. For a few days she has had the urge to urinate frequently but when she goes she can not void. Denies dysuria. +FM. Denies vaginal bleeding. Increased milky discharge but denies leaking of fluid.   Pain score: 7/10 Vitals:   06/29/22 1915  BP: 135/82  Pulse: (!) 120  Resp: 18  Temp: 98.5 F (36.9 C)  SpO2: 100%     FHT:135 Lab orders placed from triage: UA

## 2022-06-29 NOTE — MAU Note (Addendum)
Patient reports that she has been feeling tightening and pressure since about 1600 or 1700 that has been lasting for 40 minutes and has been occurring every hour. Endorses good fetal movement, denies LOF or vaginal bleeding. Reports that she feels more pressure on the right side.
